# Patient Record
Sex: Female | Born: 1960 | Race: White | Hispanic: No | Smoking: Never smoker
Health system: Southern US, Community
[De-identification: ages and names within clinical notes are randomized; demographics above are authoritative.]

## PROBLEM LIST (undated history)

## (undated) DIAGNOSIS — R011 Cardiac murmur, unspecified: Secondary | ICD-10-CM

---

## 2004-06-06 ENCOUNTER — Observation Stay (HOSPITAL_COMMUNITY): Admission: AD | Admit: 2004-06-06 | Discharge: 2004-06-07 | Payer: Self-pay | Admitting: Family Medicine

## 2005-01-20 ENCOUNTER — Ambulatory Visit (HOSPITAL_COMMUNITY): Admission: RE | Admit: 2005-01-20 | Discharge: 2005-01-20 | Payer: Self-pay | Admitting: Family Medicine

## 2011-05-19 ENCOUNTER — Telehealth: Payer: Self-pay | Admitting: *Deleted

## 2011-05-19 NOTE — Telephone Encounter (Signed)
CHART OPENED IN ERROR

## 2015-03-05 ENCOUNTER — Encounter (HOSPITAL_COMMUNITY): Payer: Self-pay | Admitting: Emergency Medicine

## 2015-03-05 ENCOUNTER — Emergency Department (HOSPITAL_COMMUNITY): Payer: No Typology Code available for payment source

## 2015-03-05 ENCOUNTER — Emergency Department (HOSPITAL_COMMUNITY)
Admission: EM | Admit: 2015-03-05 | Discharge: 2015-03-05 | Disposition: A | Discharge status: Home / Self Care | Payer: No Typology Code available for payment source | Attending: Emergency Medicine | Admitting: Emergency Medicine

## 2015-03-05 DIAGNOSIS — Y998 Other external cause status: Secondary | ICD-10-CM | POA: Insufficient documentation

## 2015-03-05 DIAGNOSIS — Y92410 Unspecified street and highway as the place of occurrence of the external cause: Secondary | ICD-10-CM | POA: Insufficient documentation

## 2015-03-05 DIAGNOSIS — R011 Cardiac murmur, unspecified: Secondary | ICD-10-CM | POA: Diagnosis not present

## 2015-03-05 DIAGNOSIS — S299XXA Unspecified injury of thorax, initial encounter: Secondary | ICD-10-CM | POA: Diagnosis not present

## 2015-03-05 DIAGNOSIS — S161XXA Strain of muscle, fascia and tendon at neck level, initial encounter: Secondary | ICD-10-CM | POA: Diagnosis not present

## 2015-03-05 DIAGNOSIS — Y9389 Activity, other specified: Secondary | ICD-10-CM | POA: Insufficient documentation

## 2015-03-05 DIAGNOSIS — S199XXA Unspecified injury of neck, initial encounter: Secondary | ICD-10-CM | POA: Diagnosis present

## 2015-03-05 HISTORY — DX: Cardiac murmur, unspecified: R01.1

## 2015-03-05 MED ORDER — TRAMADOL HCL 50 MG PO TABS
50.0000 mg | ORAL_TABLET | Freq: Four times a day (QID) | ORAL | Status: AC | PRN
Start: 2015-03-05 — End: ?
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
## 2015-03-05 NOTE — ED Notes (Signed)
MD at bedside. 

## 2015-03-05 NOTE — ED Notes (Signed)
Pt given discharge instructions and voiced understanding. V/S and signature obtained. Pt left in NAD.
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
## 2020-03-26 ENCOUNTER — Emergency Department (HOSPITAL_COMMUNITY)
Admission: EM | Admit: 2020-03-26 | Discharge: 2020-03-26 | Disposition: A | Discharge status: Home / Self Care | Payer: Self-pay | Attending: Emergency Medicine | Admitting: Emergency Medicine

## 2020-03-26 ENCOUNTER — Other Ambulatory Visit: Payer: Self-pay

## 2020-03-26 ENCOUNTER — Encounter (HOSPITAL_COMMUNITY): Payer: Self-pay | Admitting: Emergency Medicine

## 2020-03-26 DIAGNOSIS — Z23 Encounter for immunization: Secondary | ICD-10-CM | POA: Insufficient documentation

## 2020-03-26 DIAGNOSIS — Y93H2 Activity, gardening and landscaping: Secondary | ICD-10-CM | POA: Insufficient documentation

## 2020-03-26 DIAGNOSIS — Y998 Other external cause status: Secondary | ICD-10-CM | POA: Insufficient documentation

## 2020-03-26 DIAGNOSIS — S81812A Laceration without foreign body, left lower leg, initial encounter: Secondary | ICD-10-CM | POA: Insufficient documentation

## 2020-03-26 DIAGNOSIS — Y92007 Garden or yard of unspecified non-institutional (private) residence as the place of occurrence of the external cause: Secondary | ICD-10-CM | POA: Insufficient documentation

## 2020-03-26 DIAGNOSIS — W5581XA Bitten by other mammals, initial encounter: Secondary | ICD-10-CM | POA: Insufficient documentation

## 2020-03-26 MED ORDER — AMOXICILLIN-POT CLAVULANATE 875-125 MG PO TABS
1.0000 | ORAL_TABLET | Freq: Once | ORAL | Status: AC
Start: 2020-03-26 — End: 2020-03-26
  Administered 2020-03-26: 1 via ORAL
  Filled 2020-03-26: qty 1

## 2020-03-26 MED ORDER — TETANUS-DIPHTH-ACELL PERTUSSIS 5-2.5-18.5 LF-MCG/0.5 IM SUSP
0.5000 mL | Freq: Once | INTRAMUSCULAR | Status: AC
  Administered 2020-03-26: 0.5 mL via INTRAMUSCULAR
  Filled 2020-03-26: qty 0.5

## 2020-03-26 MED ORDER — AMOXICILLIN-POT CLAVULANATE 600-42.9 MG/5ML PO SUSR: 600.0000 mg | Freq: Two times a day (BID) | ORAL | 0 refills | Status: AC

## 2020-03-26 MED ORDER — LIDOCAINE-EPINEPHRINE (PF) 2 %-1:200000 IJ SOLN
20.0000 mL | Freq: Once | INTRAMUSCULAR | Status: AC
  Administered 2020-03-26: 20 mL
  Filled 2020-03-26: qty 20

## 2020-03-26 NOTE — ED Provider Notes (Signed)
[facility] EMERGENCY DEPARTMENT Provider Note   CSN: [id number] Arrival date & time: 03/26/20  1755     History Chief Complaint  Patient presents with  . Animal Bite    Pot-belly Pig    Autumn Morales is a 59 y.o. female past medical history significant for heart murmur. Tetanus is not up to date.  HPI Patient presents to emergency department today with chief complaint of animal bite on her left leg.  Happening just prior to arrival.  Patient states she had been out weeding her garden all day.  She states a neighbor has a female potbelly pig that gets out frequently.  She is not sure who the owner exactly is.  She was feeding the pig cantaloupe earlier today.  Pig had been following her around in the garden.  When she had her back turned she was either bit in the leg or scratched by the leg.  She is unsure which .Bleeding was controlled by pressure bandage. She has throbbing pain localized to the bite. Able to ambulate without difficulty.  Denies any fever, chills, numbness, tingling, weakness.    Past Medical History:  Diagnosis Date  . Murmur     There are no problems to display for this patient.   History reviewed. No pertinent surgical history.   OB History   No obstetric history on file.     Family History  Problem Relation Age of Onset  . Heart failure Brother   . Diabetes Brother     Social History   Tobacco Use  . Smoking status: Never Smoker  . Smokeless tobacco: Never Used  Substance Use Topics  . Alcohol use: No  . Drug use: No    Home Medications Prior to Admission medications   Medication Sig Start Date End Date Taking? Authorizing Provider  amoxicillin-clavulanate (AUGMENTIN) 600-42.9 MG/5ML suspension Take 5 mLs (600 mg total) by mouth 2 (two) times daily for 7 days. 03/26/20 04/02/20  Meara Wiechman E, PA-C  traMADol (ULTRAM) 50 MG tablet Take 1 tablet (50 mg total) by mouth every 6 (six) hours as needed. 03/05/15   Geoffery Lyons, MD     Allergies    Codeine  Review of Systems   Review of Systems All other systems are reviewed and are negative for acute change except as noted in the HPI.  Physical Exam Updated Vital Signs BP 140/67   Pulse 86   Temp 98 F (36.7 C) (Oral)   Resp 18   Ht 5\' 3"  (1.6 m)   Wt 59 kg   LMP 02/26/2015   SpO2 99%   BMI 23.03 kg/m   Physical Exam Vitals and nursing note reviewed.  Constitutional:      Appearance: She is well-developed. She is not ill-appearing or toxic-appearing.  HENT:     Head: Normocephalic and atraumatic.     Nose: Nose normal.  Eyes:     General: No scleral icterus.       Right eye: No discharge.        Left eye: No discharge.     Conjunctiva/sclera: Conjunctivae normal.  Neck:     Vascular: No JVD.  Cardiovascular:     Rate and Rhythm: Normal rate and regular rhythm.     Pulses: Normal pulses.          Dorsalis pedis pulses are 2+ on the right side and 2+ on the left side.     Heart sounds: Normal heart sounds.  Pulmonary:  Effort: Pulmonary effort is normal.     Breath sounds: Normal breath sounds.  Abdominal:     General: There is no distension.  Musculoskeletal:        General: Normal range of motion.     Cervical back: Normal range of motion.     Comments: Full range of motion of left knee and ankle.  Ambulatory with normal gait.  Skin:    General: Skin is warm and dry.     Comments: 3.5 cm gaping linear laceration to left anterior shin.  Bleeding is controlled with pressure.  Neurological:     Mental Status: She is oriented to person, place, and time.     GCS: GCS eye subscore is 4. GCS verbal subscore is 5. GCS motor subscore is 6.     Comments: Fluent speech, no facial droop.  Psychiatric:        Behavior: Behavior normal.     ED Results / Procedures / Treatments   Labs (all labs ordered are listed, but only abnormal results are displayed) Labs Reviewed - No data to display  EKG None  Radiology No results  found.  Procedures .Marland KitchenLaceration Repair  Date/Time: 03/26/2020 11:09 PM Performed by: Sherene Sires, PA-C Authorized by: Sherene Sires, PA-C   Consent:    Consent obtained:  Verbal   Consent given by:  Patient   Risks discussed:  Infection and retained foreign body   Alternatives discussed:  No treatment Anesthesia (see MAR for exact dosages):    Anesthesia method:  Local infiltration   Local anesthetic:  Lidocaine 2% WITH epi Laceration details:    Location:  Leg   Leg location:  L lower leg   Length (cm):  3.5   Depth (mm):  4 Repair type:    Repair type:  Simple Pre-procedure details:    Preparation:  Imaging obtained to evaluate for foreign bodies and patient was prepped and draped in usual sterile fashion Exploration:    Hemostasis achieved with:  Direct pressure   Wound exploration: wound explored through full range of motion and entire depth of wound probed and visualized     Wound extent: no muscle damage noted and no tendon damage noted   Treatment:    Area cleansed with:  Saline   Amount of cleaning:  Extensive   Irrigation solution:  Sterile saline   Irrigation volume:  2000 ml   Irrigation method:  Pressure wash   Visualized foreign bodies/material removed: no   Skin repair:    Repair method:  Sutures   Suture size:  5-0   Suture material:  Prolene   Suture technique:  Simple interrupted   Number of sutures:  7 Approximation:    Approximation:  Loose Post-procedure details:    Dressing:  Bulky dressing   Patient tolerance of procedure:  Tolerated well, no immediate complications   (including critical care time)  Medications Ordered in ED Medications  lidocaine-EPINEPHrine (XYLOCAINE W/EPI) 2 %-1:200000 (PF) injection 20 mL (20 mLs Infiltration Given 03/26/20 2112)  Tdap (BOOSTRIX) injection 0.5 mL (0.5 mLs Intramuscular Given 03/26/20 2112)  amoxicillin-clavulanate (AUGMENTIN) 875-125 MG per tablet 1 tablet (1 tablet Oral Given 03/26/20 2112)     ED Course  I have reviewed the triage vital signs and the nursing notes.  Pertinent labs & imaging results that were available during my care of the patient were reviewed by me and considered in my medical decision making (see chart for details).    MDM Rules/Calculators/A&P  Patient presents to the emergency department with laceration to left anterior shin which occurred within 1 hour PTA.  Wound is linear, no obvious puncture wounds.  Patient nontoxic appearing, resting comfortably.   Discussed case with poison control.  There are recommending consult to the state vet and office of epidemiology.  Case discussed with the on-call state that who states patient does not need the rabies series as livestock in the situation domesticated pig is unlikely to have rabies.  They recommend pig be quarantined for x15 days.  At the end of quarantine if pig is asymptomatic no further interventions needed.  Poison control was able to find the owner of the pig and it will be quarantined for 15 days.  Pressure irrigation performed. Wound explored and base of wound visualized in a bloodless field without evidence of foreign body. Laceration repair per procedure note above, tolerated well. Tetanus updated at today's visit.  We will cover with Augmentin.  Discussed suture home care as well as need for wound recheck and suture removal in 10 days.  I discussed results, treatment plan, need for follow-up, and return precautions with the patient including signs of infection. Provided opportunity for questions, patient confirmed understanding and is in agreement with plan.     Portions of this note were generated with Scientist, clinical (histocompatibility and immunogenetics). Dictation errors may occur despite best attempts at proofreading.   Final Clinical Impression(s) / ED Diagnoses Final diagnoses:  Laceration of left lower extremity, initial encounter    Rx / DC Orders ED Discharge Orders         Ordered     amoxicillin-clavulanate (AUGMENTIN) 600-42.9 MG/5ML suspension  2 times daily        03/26/20 2233           Sherene Sires, PA-C 03/26/20 2320    Maia Plan, MD 03/27/20 0040

## 2020-03-26 NOTE — ED Triage Notes (Signed)
Patient was bite by pot-belly pig, on left lower anterior leg, bleeding controlled large laceration to leg, cleaned and dressed.

## 2020-03-26 NOTE — ED Triage Notes (Signed)
Per patient, Lexicographer at residence trying to contain pig.

## 2020-03-26 NOTE — Discharge Instructions (Signed)
1. Medications: Tylenol or ibuprofen for pain, usual home medications Prescription sent to your pharmacy for Augmentin.  This is an antibiotic used to treat wounds after an animal bite.  Please take as prescribed.  It can cause an upset stomach so you should take it with yogurt.  2. Treatment: ice for swelling, keep wound clean with warm soap and water and keep bandage dry, do not submerge in water for 24 hours  3. Follow Up: Please return in 10 days to have your stitches removed or sooner if you have concerns. Your imary care doctor can also remove the stitches. Return to the emergency department for increased redness, drainage of pus from the wound   WOUND CARE  Keep area clean and dry for 24 hours. Do not remove bandage, if applied.  After 24 hours, remove bandage and wash wound gently with mild soap and warm water. Reapply a new bandage after cleaning wound, if directed.   Continue daily cleansing with soap and water until stitches/staples are removed.  Do not apply any ointments or creams to the wound while stitches/staples are in place, as this may cause delayed healing. Return if you experience any of the following signs of infection: Swelling, redness, pus drainage, streaking, fever >101.0 F  Return if you experience excessive bleeding that does not stop after 15-20 minutes of constant, firm pressure.

## 2020-04-06 ENCOUNTER — Encounter (HOSPITAL_COMMUNITY): Payer: Self-pay

## 2020-04-06 ENCOUNTER — Other Ambulatory Visit: Payer: Self-pay

## 2020-04-06 ENCOUNTER — Emergency Department (HOSPITAL_COMMUNITY)
Admission: EM | Admit: 2020-04-06 | Discharge: 2020-04-06 | Disposition: A | Discharge status: Home / Self Care | Payer: Self-pay | Attending: Emergency Medicine | Admitting: Emergency Medicine

## 2020-04-06 DIAGNOSIS — Y92007 Garden or yard of unspecified non-institutional (private) residence as the place of occurrence of the external cause: Secondary | ICD-10-CM | POA: Insufficient documentation

## 2020-04-06 DIAGNOSIS — Z4802 Encounter for removal of sutures: Secondary | ICD-10-CM

## 2020-04-06 DIAGNOSIS — W5542XD Struck by pig, subsequent encounter: Secondary | ICD-10-CM | POA: Insufficient documentation

## 2020-04-06 DIAGNOSIS — S81812D Laceration without foreign body, left lower leg, subsequent encounter: Secondary | ICD-10-CM | POA: Insufficient documentation

## 2020-04-06 MED ORDER — CEPHALEXIN 250 MG/5ML PO SUSR: 500.0000 mg | Freq: Four times a day (QID) | ORAL | 0 refills | Status: DC

## 2020-04-06 MED ORDER — CEPHALEXIN 250 MG/5ML PO SUSR: 500.0000 mg | Freq: Four times a day (QID) | ORAL | 0 refills | Status: AC

## 2020-04-06 NOTE — ED Triage Notes (Signed)
Pt here for suture removal

## 2020-04-06 NOTE — Discharge Instructions (Addendum)
I am prescribing you an antibiotic called Keflex.  You are going to take 500 mg of this, 4 times per day for the next 5 days.  Please complete this antibiotic in its entirety.  Please follow-up with your regular doctor on Monday to schedule an appointment to have your wound reevaluated.  You can always return to the ER with new or worsening symptoms.  It was a pleasure to meet you.

## 2020-04-06 NOTE — ED Provider Notes (Signed)
[facility] EMERGENCY DEPARTMENT Provider Note   CSN: [id number] Arrival date & time: 04/06/20  9470     History Chief Complaint  Patient presents with  . Suture / Staple Removal    Autumn Morales is a 59 y.o. female.  HPI Patient is a 59 year old female who presents to the emergency department for suture removal.  Patient had 7 sutures placed on September 20 after she was struck by a large pig in her yard.  Patient denies that she was bitten.  She notes some mild erythema surrounding the wound with minimal tenderness.  No purulent drainage from the site.  Patient has been irrigating the wound daily and keeping it dry.  She denies any fevers, chills, nausea, vomiting.    Past Medical History:  Diagnosis Date  . Murmur     There are no problems to display for this patient.   History reviewed. No pertinent surgical history.   OB History   No obstetric history on file.     Family History  Problem Relation Age of Onset  . Heart failure Brother   . Diabetes Brother     Social History   Tobacco Use  . Smoking status: Never Smoker  . Smokeless tobacco: Never Used  Substance Use Topics  . Alcohol use: No  . Drug use: No    Home Medications Prior to Admission medications   Medication Sig Start Date End Date Taking? Authorizing Provider  traMADol (ULTRAM) 50 MG tablet Take 1 tablet (50 mg total) by mouth every 6 (six) hours as needed. 03/05/15   Geoffery Lyons, MD    Allergies    Codeine  Review of Systems   Review of Systems  Constitutional: Negative for chills and fever.  Gastrointestinal: Negative for nausea and vomiting.  Skin: Positive for color change and wound.   Physical Exam Updated Vital Signs BP (!) 162/75 (BP Location: Right Arm)   Pulse 94   Temp (!) 97.5 F (36.4 C) (Oral)   Resp 12   Ht 5\' 3"  (1.6 m)   Wt 56.7 kg   LMP 02/26/2015   SpO2 100%   BMI 22.14 kg/m   Physical Exam Vitals and nursing note reviewed.  Constitutional:       General: She is not in acute distress.    Appearance: She is well-developed.  HENT:     Head: Normocephalic and atraumatic.     Right Ear: External ear normal.     Left Ear: External ear normal.  Eyes:     General: No scleral icterus.       Right eye: No discharge.        Left eye: No discharge.     Conjunctiva/sclera: Conjunctivae normal.  Neck:     Trachea: No tracheal deviation.  Cardiovascular:     Rate and Rhythm: Normal rate.  Pulmonary:     Effort: Pulmonary effort is normal. No respiratory distress.     Breath sounds: No stridor.  Abdominal:     General: There is no distension.  Musculoskeletal:        General: No swelling or deformity.     Cervical back: Neck supple.  Skin:    General: Skin is warm and dry.     Findings: No rash.     Comments: Well approximated linear laceration noted to the left shin.  Wound is well-healing and has a small amount of erythema surrounding the edges.  Very mild tenderness overlying the site.  No appreciable edema.  No discharge noted with manipulation of the wound.  No increased warmth overlying the wound.  Neurological:     Mental Status: She is alert.     Cranial Nerves: Cranial nerve deficit: no gross deficits.    ED Results / Procedures / Treatments   Labs (all labs ordered are listed, but only abnormal results are displayed) Labs Reviewed - No data to display  EKG None  Radiology No results found.  Procedures .Suture Removal  Date/Time: 04/06/2020 11:26 AM Performed by: Placido Sou, PA-C Authorized by: Placido Sou, PA-C   Consent:    Consent obtained:  Verbal   Consent given by:  Patient   Risks discussed:  Pain, bleeding and wound separation Location:    Location:  Lower extremity   Lower extremity location:  Leg   Leg location:  L lower leg Procedure details:    Wound appearance:  Red, clean, good wound healing and tender   Number of sutures removed:  7 Post-procedure details:    Post-removal:  No  dressing applied   Patient tolerance of procedure:  Tolerated well, no immediate complications   (including critical care time)  Medications Ordered in ED Medications - No data to display  ED Course  I have reviewed the triage vital signs and the nursing notes.  Pertinent labs & imaging results that were available during my care of the patient were reviewed by me and considered in my medical decision making (see chart for details).    MDM Rules/Calculators/A&P                          Patient presents today for suture removal.  She had 7 sutures placed on September 20 after she was struck by a large pig.  All 7 sutures were removed without complication.  She notes that she was prescribed Augmentin and took this in its entirety.  There is still a small amount of surrounding erythema.  Will place patient on 5 days of Keflex.  Otherwise, wound appears to be healing well.  She is afebrile.    Recommended that she call her PCP on Monday to schedule a follow-up appointment so that she may once again have her wound reevaluated.  She understands return to the ER if she develops any new or worsening symptoms.  Her questions were answered and she was amicable at the time of discharge.    Note: Portions of this report may have been transcribed using voice recognition software. Every effort was made to ensure accuracy; however, inadvertent computerized transcription errors may be present.   Final Clinical Impression(s) / ED Diagnoses Final diagnoses:  Visit for suture removal   Rx / DC Orders ED Discharge Orders         Ordered    cephALEXin (KEFLEX) 250 MG/5ML suspension  4 times daily        04/06/20 1123           Placido Sou, PA-C 04/06/20 1130    Bethann Berkshire, MD 04/07/20 [phone]

## 2023-11-02 ENCOUNTER — Ambulatory Visit
Admission: EM | Admit: 2023-11-02 | Discharge: 2023-11-02 | Disposition: A | Discharge status: Home / Self Care | Payer: Self-pay | Attending: Nurse Practitioner | Admitting: Nurse Practitioner

## 2023-11-02 DIAGNOSIS — S70361A Insect bite (nonvenomous), right thigh, initial encounter: Secondary | ICD-10-CM

## 2023-11-02 DIAGNOSIS — L03115 Cellulitis of right lower limb: Secondary | ICD-10-CM

## 2023-11-02 DIAGNOSIS — W57XXXA Bitten or stung by nonvenomous insect and other nonvenomous arthropods, initial encounter: Secondary | ICD-10-CM

## 2023-11-02 MED ORDER — DOXYCYCLINE HYCLATE 100 MG PO CAPS: 100.0000 mg | ORAL_CAPSULE | Freq: Two times a day (BID) | ORAL | 0 refills | Status: AC

## 2023-11-02 NOTE — ED Provider Notes (Signed)
 [facility]    CSN: [id number] Arrival date & time: 11/02/23  1059      History   Chief Complaint No chief complaint on file.   HPI Autumn Morales is a 63 y.o. female.   Patient presents today for tick bite to right lower extremity.  She reports she thinks the tick bit her on Friday and she remove the tick on Saturday.  Reports she really scratched at the area after she remove the tick because it was itchy and now it is red and swollen and warm to touch.  She denies fevers, bodyaches or chills, nausea/vomiting, change in appetite.  No headaches, new muscle pain/joint aches.  She has tried over-the-counter hydrocortisone cream without relief.    Past Medical History:  Diagnosis Date   Murmur     There are no active problems to display for this patient.   History reviewed. No pertinent surgical history.  OB History   No obstetric history on file.      Home Medications    Prior to Admission medications   Medication Sig Start Date End Date Taking? Authorizing Provider  doxycycline (VIBRAMYCIN) 100 MG capsule Take 1 capsule (100 mg total) by mouth 2 (two) times daily for 7 days. 11/02/23 11/09/23 Yes Wilhemena Harbour, NP  traMADol  (ULTRAM ) 50 MG tablet Take 1 tablet (50 mg total) by mouth every 6 (six) hours as needed. 03/05/15   Orvilla Blander, MD    Family History Family History  Problem Relation Age of Onset   Heart failure Brother    Diabetes Brother     Social History Social History   Tobacco Use   Smoking status: Never   Smokeless tobacco: Never  Substance Use Topics   Alcohol use: No   Drug use: No     Allergies   Codeine   Review of Systems Review of Systems Per HPI  Physical Exam Triage Vital Signs ED Triage Vitals  Encounter Vitals Group     BP 11/02/23 1222 (!) 155/75     Systolic BP Percentile --      Diastolic BP Percentile --      Pulse Rate 11/02/23 1222 69     Resp 11/02/23 1222 20     Temp 11/02/23 1222 98.1 F  (36.7 C)     Temp Source 11/02/23 1222 Oral     SpO2 11/02/23 1222 98 %     Weight --      Height --      Head Circumference --      Peak Flow --      Pain Score 11/02/23 1223 0     Pain Loc --      Pain Education --      Exclude from Growth Chart --    No data found.  Updated Vital Signs BP (!) 155/75 (BP Location: Right Arm)   Pulse 69   Temp 98.1 F (36.7 C) (Oral)   Resp 20   LMP 02/26/2015   SpO2 98%   Visual Acuity Right Eye Distance:   Left Eye Distance:   Bilateral Distance:    Right Eye Near:   Left Eye Near:    Bilateral Near:     Physical Exam Vitals and nursing note reviewed.  Constitutional:      General: She is not in acute distress.    Appearance: Normal appearance. She is not toxic-appearing.  HENT:     Mouth/Throat:     Mouth: Mucous membranes are  moist.     Pharynx: Oropharynx is clear.  Pulmonary:     Effort: Pulmonary effort is normal. No respiratory distress.  Skin:    General: Skin is warm and dry.     Capillary Refill: Capillary refill takes less than 2 seconds.     Findings: Erythema present.          Comments: Erythematous plaque noted to right lower extremity in approximately area marked.  There is a scab near the center.  The area is warm to touch and tender, however no active drainage or oozing.  Neurological:     Mental Status: She is alert and oriented to person, place, and time.  Psychiatric:        Behavior: Behavior is cooperative.      UC Treatments / Results  Labs (all labs ordered are listed, but only abnormal results are displayed) Labs Reviewed - No data to display  EKG   Radiology No results found.  Procedures Procedures (including critical care time)  Medications Ordered in UC Medications - No data to display  Initial Impression / Assessment and Plan / UC Course  I have reviewed the triage vital signs and the nursing notes.  Pertinent labs & imaging results that were available during my care of the  patient were reviewed by me and considered in my medical decision making (see chart for details).   Patient is well-appearing, normotensive, afebrile, not tachycardic, not tachypneic, oxygenating well on room air.    1. Cellulitis of right lower extremity 2. Tick bite of right thigh, initial encounter Treat with doxycycline twice daily for 7 days Wound care and supportive care discussed with patient Return to ER precautions discussed  The patient was given the opportunity to ask questions.  All questions answered to their satisfaction.  The patient is in agreement to this plan.   Final Clinical Impressions(s) / UC Diagnoses   Final diagnoses:  Cellulitis of right lower extremity  Tick bite of right thigh, initial encounter     Discharge Instructions      We are treating you today for infection in your right lower extremity.  Take the doxycycline twice daily for 7 days as prescribed to treat the infection.  Seek care if you develop fever, body aches or chills, headache, neck stiffness, or new muscle pain/joint aches despite treatment.  You can continue hydrocortisone cream as needed for itching.    ED Prescriptions     Medication Sig Dispense Auth. Provider   doxycycline (VIBRAMYCIN) 100 MG capsule Take 1 capsule (100 mg total) by mouth 2 (two) times daily for 7 days. 14 capsule Wilhemena Harbour, NP      PDMP not reviewed this encounter.   Wilhemena Harbour, NP 11/02/23 1326

## 2023-11-02 NOTE — ED Triage Notes (Signed)
 Pt reports tick bite since Thursday night. Located in the back of the knee, redness swelling and heat at the site of the bite.

## 2023-11-02 NOTE — Discharge Instructions (Signed)
 We are treating you today for infection in your right lower extremity.  Take the doxycycline twice daily for 7 days as prescribed to treat the infection.  Seek care if you develop fever, body aches or chills, headache, neck stiffness, or new muscle pain/joint aches despite treatment.  You can continue hydrocortisone cream as needed for itching.
